# Patient Record
Sex: Female | Born: 2000 | Race: White | Hispanic: No | Marital: Single | Smoking: Never smoker
Health system: Southern US, Community
[De-identification: ages and names within clinical notes are randomized; demographics above are authoritative.]

## PROBLEM LIST (undated history)

## (undated) DIAGNOSIS — S0990XA Unspecified injury of head, initial encounter: Secondary | ICD-10-CM

## (undated) DIAGNOSIS — J302 Other seasonal allergic rhinitis: Secondary | ICD-10-CM

## (undated) DIAGNOSIS — J45909 Unspecified asthma, uncomplicated: Secondary | ICD-10-CM

---
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
## 2019-08-19 IMAGING — CT CT ABD-PELV W/ CM
2 of 4 series · 16 of 46 positions shown, 18 images · IV contrast (APPLIED)
Comparison: None.

CLINICAL DATA: Sudden onset sharp right-sided abdominal pain.

EXAM:
CT ABDOMEN AND PELVIS WITH CONTRAST
TECHNIQUE: Multidetector CT imaging of the abdomen and pelvis was performed
using the standard protocol following bolus administration of
intravenous contrast.
CONTRAST:  100mL JPT1DW-SVV IOPAMIDOL (JPT1DW-SVV) INJECTION 61%

[Series 2: axial st · axial · 0.87mm/px · z∈[+233,+703]mm · 13 of 102 slices shown, 15 images]
[im 4/102  soft-tissue]
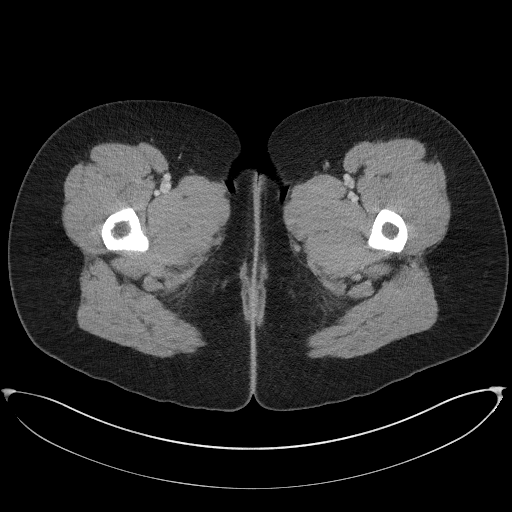
[im 4/102  bone]
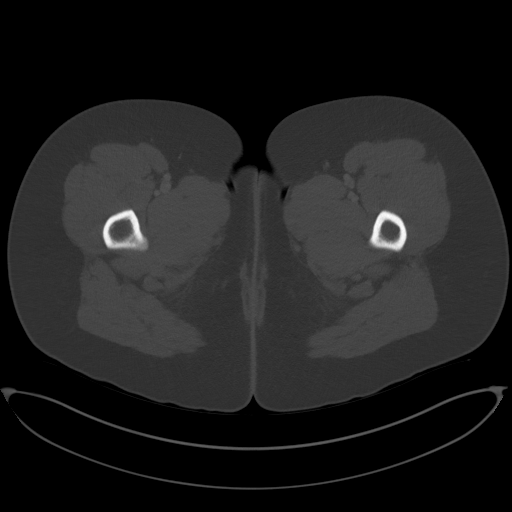
[im 12/102  soft-tissue]
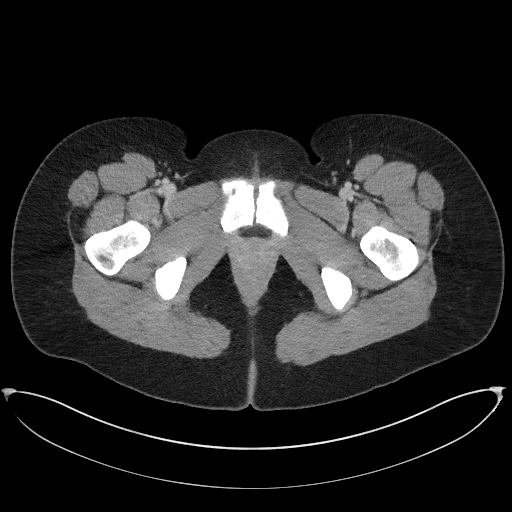
[im 20/102  soft-tissue]
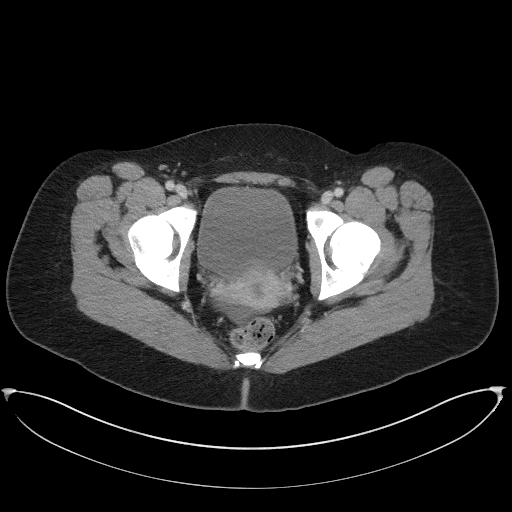
[im 28/102  soft-tissue]
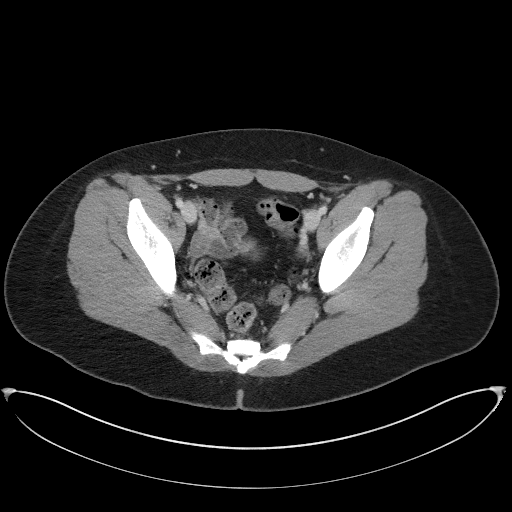
[im 35/102  soft-tissue]
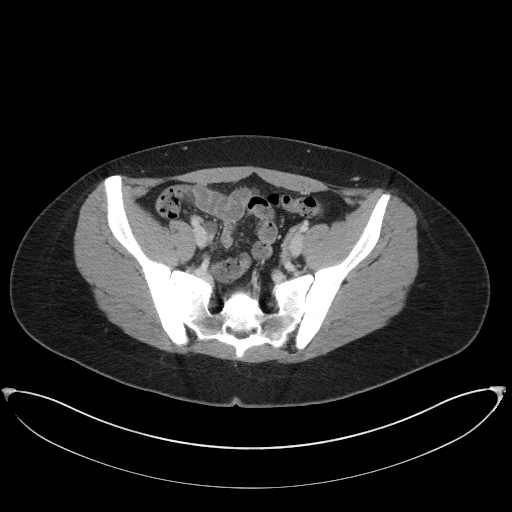
[im 43/102  soft-tissue]
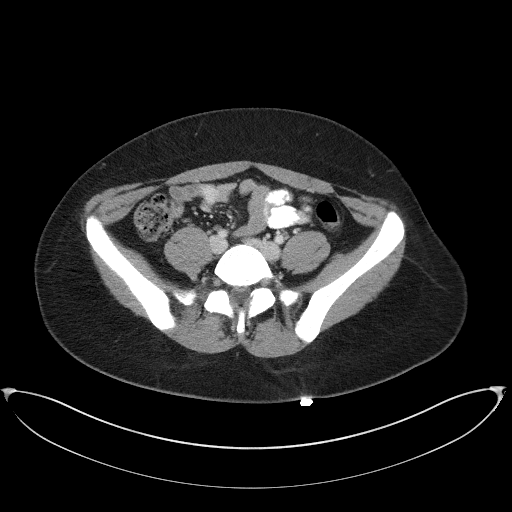
[im 51/102  soft-tissue]
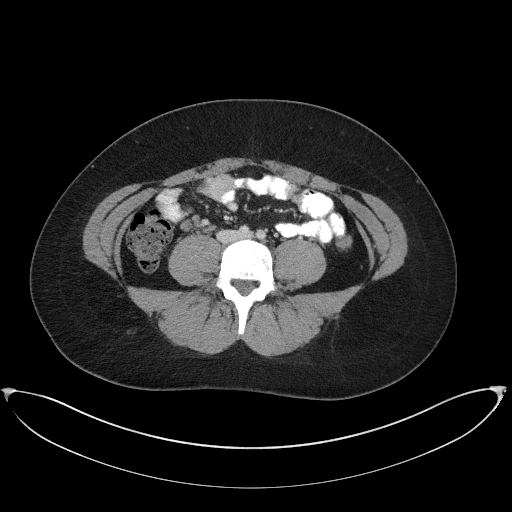
[im 59/102  soft-tissue]
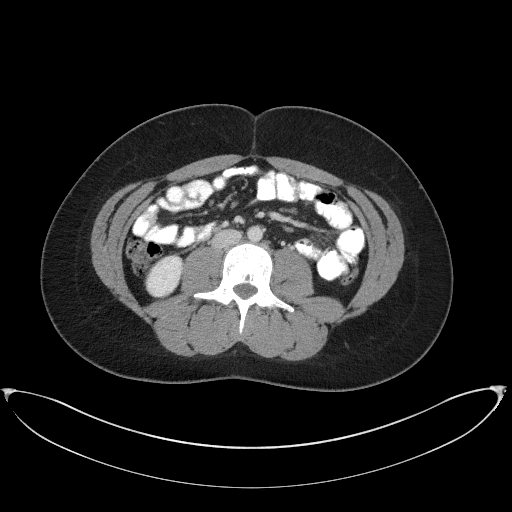
[im 67/102  soft-tissue]
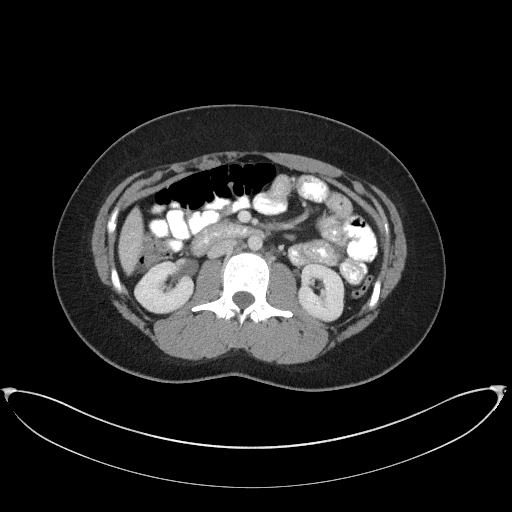
[im 67/102  bone]
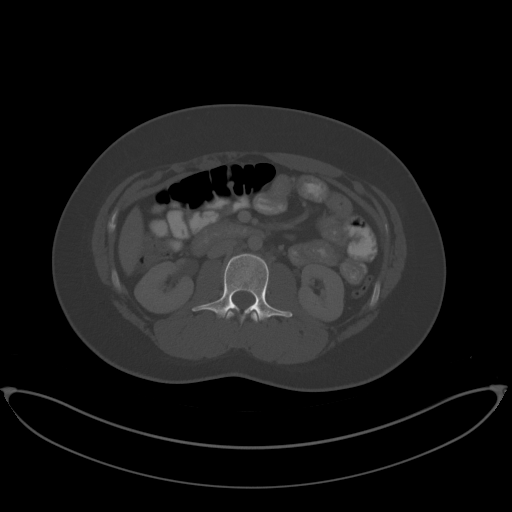
[im 74/102  soft-tissue]
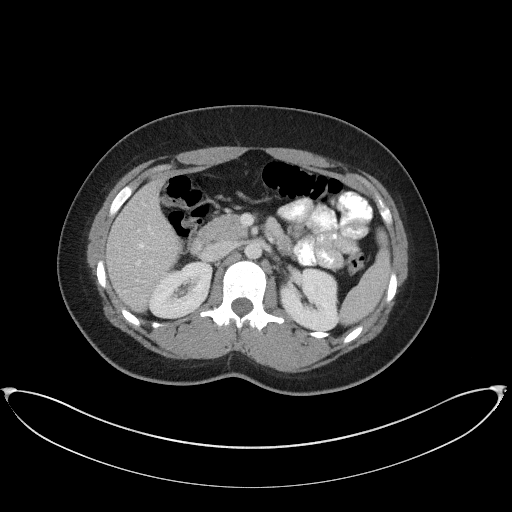
[im 82/102  soft-tissue]
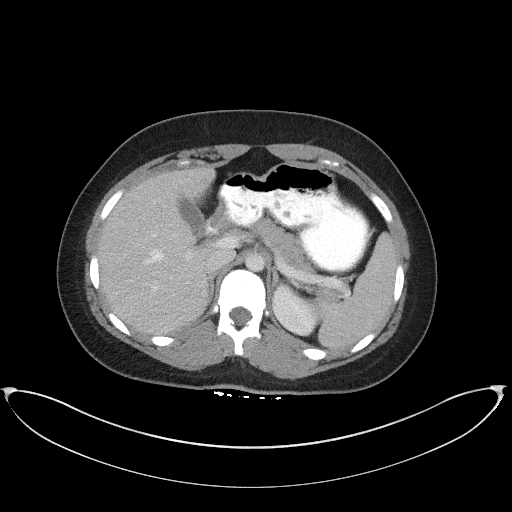
[im 90/102  soft-tissue]
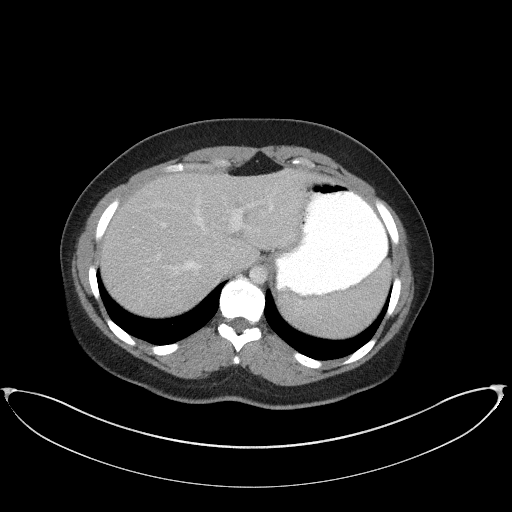
[im 98/102  soft-tissue]
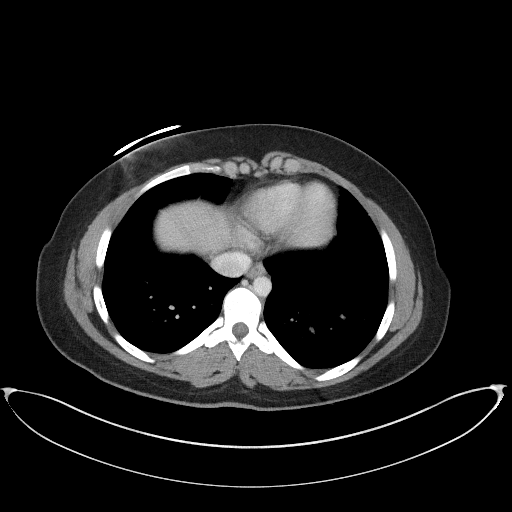

[Series 5: coronal st · coronal · 0.86mm/px · 3 of 85 slices shown]
[im 29/85  soft-tissue]
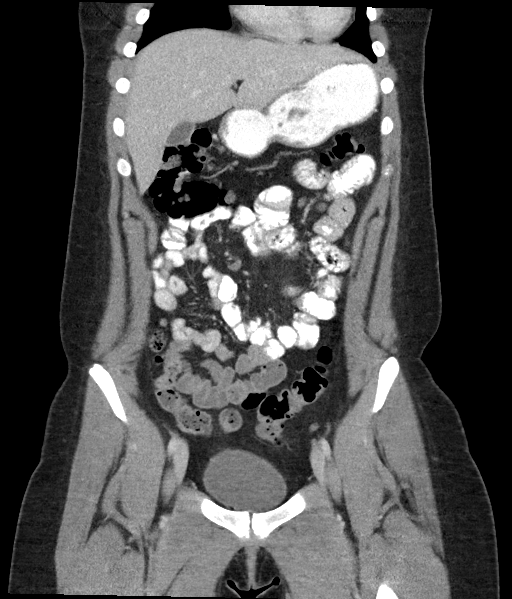
[im 38/85  soft-tissue]
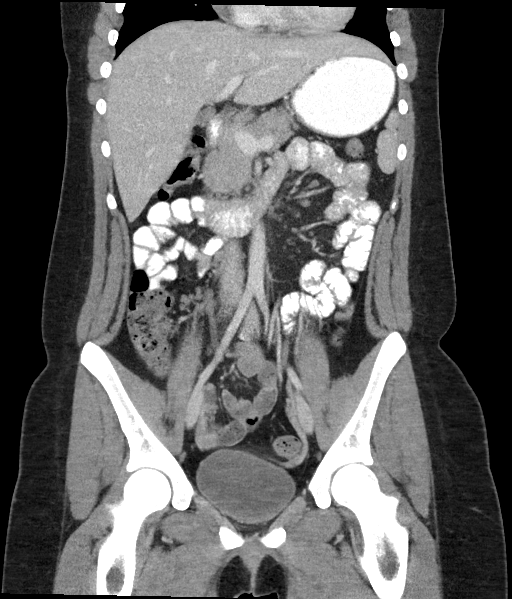
[im 47/85  soft-tissue]
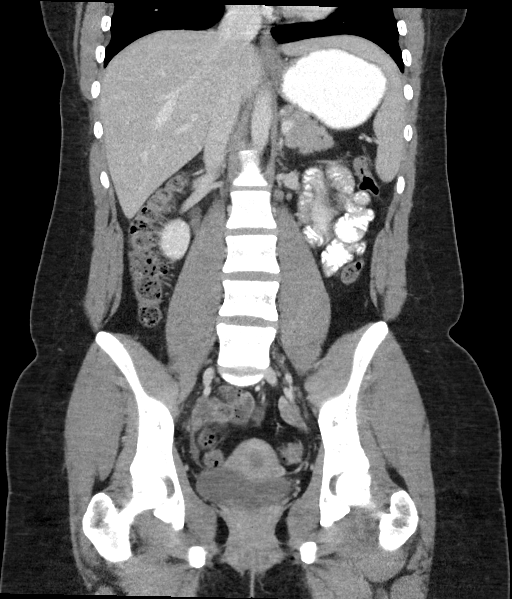

[16 of 46 positions shown; findings below may reference images not displayed]

FINDINGS: Lower chest: Unremarkable.

Hepatobiliary: No suspicious focal abnormality within the liver
parenchyma. There is no evidence for gallstones, gallbladder wall
thickening, or pericholecystic fluid. No intrahepatic or
extrahepatic biliary dilation.

Pancreas: No focal mass lesion. No dilatation of the main duct. No
intraparenchymal cyst. No peripancreatic edema.

Spleen: No splenomegaly. No focal mass lesion.

Adrenals/Urinary Tract: No adrenal nodule or mass. Kidneys
unremarkable No evidence for hydroureter. The urinary bladder
appears normal for the degree of distention.

Stomach/Bowel: Stomach is unremarkable. No gastric wall thickening.
No evidence of outlet obstruction. Duodenum is normally positioned
as is the ligament of Treitz. No small bowel wall thickening. No
small bowel dilatation. The terminal ileum is normal. The appendix
is normal. No gross colonic mass. No colonic wall thickening.

Vascular/Lymphatic: No abdominal aortic aneurysm. No abdominal
aortic atherosclerotic calcification. There is no gastrohepatic or
hepatoduodenal ligament lymphadenopathy. No intraperitoneal or
retroperitoneal lymphadenopathy. No pelvic sidewall lymphadenopathy.

Reproductive: The uterus is unremarkable.  There is no adnexal mass.

Other: Trace free fluid noted in the cul-de-sac, a finding that can
be physiologic in a premenopausal female..

Musculoskeletal: No worrisome lytic or sclerotic osseous
abnormality.
IMPRESSION: 1. No acute findings in the abdomen or pelvis. Specifically, no
findings to explain the patient's history of right abdominal pain.
Terminal ileum and appendix are normal. No adnexal mass.

## 2021-06-12 ENCOUNTER — Ambulatory Visit: Admit: 2021-06-12 | Discharge: 2021-06-12 | Payer: BLUE CROSS/BLUE SHIELD | Attending: Family Medicine

## 2021-06-12 DIAGNOSIS — J0100 Acute maxillary sinusitis, unspecified: Secondary | ICD-10-CM

## 2021-06-12 LAB — POC COVID-19 (LIAT IN HOUSE): SARS-CoV-2: NOT DETECTED

## 2021-06-12 MED ORDER — AMOXICILLIN 875 MG PO TABS
875 MG | ORAL_TABLET | Freq: Two times a day (BID) | ORAL | 0 refills | Status: AC
Start: 2021-06-12 — End: 2021-06-22

## 2021-06-12 MED ORDER — AMOXICILLIN 875 MG PO TABS
875 MG | ORAL_TABLET | Freq: Two times a day (BID) | ORAL | 0 refills | Status: DC
Start: 2021-06-12 — End: 2021-06-12

## 2021-06-12 NOTE — Addendum Note (Signed)
Addended by: Geraldo Pitter on: 06/12/2021 11:20 AM     Modules accepted: Orders

## 2021-06-12 NOTE — Progress Notes (Signed)
[employer]    Source: Patient appears reliable    Add'l history from: Epic records    Time Patient seen by Provider:11:17 AM     Chief complaint:    Chief Complaint   Patient presents with    Congestion    Cough    Headache    Fatigue     All symptoms x 3 days       HISTORY:    Lindsey Holland is a 20 y.o. female who presents for evaluation of   Chief Complaint   Patient presents with    Congestion    Cough    Headache    Fatigue     All symptoms x 3 days     20 year old female with past medical history as noted now presents with complaint of recurrent seven-day history of facial and pressure which is been worsening over the past 2 days. + Purulent discharge. + Dry cough, headache, feverish without measured fever, fatigue. No known exposure to specific illness. Patient states that she has been ill frequently over the past semester while at college at [employer]    PAST MEDICAL HISTORY:    Patient's Past Medical History Reviewed and confirmed in epic as below:    Past Medical History:   Diagnosis Date    Allergic rhinitis     Asthma        Patient's Past Medical History Reviewed and confirmed in epic as below:    No past surgical history on file.    Patient's Family History: Reviewed and confirmed in epic as below:    No family history on file.    Patient's Social History: Reviewed and confirmed in epic as below:    Social History     Socioeconomic History    Marital status: Single       Patient's Allergies Reviewed and confirmed as listed below    No Known Allergies    Current Medications reviewed and confirmed:    Outpatient Medications Marked as Taking for the 06/12/21 encounter (Office Visit) with Geraldo Pitter, MD   Medication Sig Dispense Refill    amoxicillin (AMOXIL) 875 MG tablet Take 1 tablet by mouth 2 times daily for 10 days 20 tablet 0       Nursing Notes and Vital Signs reviewed    EXAM:    VITALS: BP 136/80    Pulse 85    Temp 98.4 ??F (36.9 ??C)    Resp 14    SpO2 100%     GENERAL: alert, well appearing,  well nourished, no distress    HEAD: Normocephalic, no masses, lesions, or other abnormalities.    EYE EXAM: normal conjunctiva    EARS: External ears normal, hearing grossly intact.    NOSE: no deformity.copious purulent discharge and edematous mucus membranes    OROPHARYNX: lips and tongue normal. No erythema    HEART: regular rate and rhythm    LUNGS: Normal respiratory rate and normal respiratory effort. Good air movement. Bilaterally clear          DIAGNOSIS:    Diagnosis Orders   1. Acute non-recurrent maxillary sinusitis  amoxicillin (AMOXIL) 875 MG tablet      2. Nasal congestion  POC COVID-19 ([facility])          PLAN:  1. Acute non-recurrent maxillary sinusitis  -     amoxicillin (AMOXIL) 875 MG tablet; Take 1 tablet by mouth 2 times daily for 10 days, Disp-20 tablet, R-0Normal  2. Nasal congestion  -     POC COVID-19 ([facility]); Future      Results for orders placed or performed in visit on 06/12/21   POC COVID-19 ([facility])   Result Value Ref Range    SARS-CoV-2 Not Detected Not Detected       Current Outpatient Medications   Medication Sig Dispense Refill    amoxicillin (AMOXIL) 875 MG tablet Take 1 tablet by mouth 2 times daily for 10 days 20 tablet 0    albuterol sulfate HFA (PROVENTIL;VENTOLIN;PROAIR) 108 (90 Base) MCG/ACT inhaler  (Patient not taking: Reported on 06/12/2021)       No current facility-administered medications for this visit.        Orders Placed This Encounter   Medications    amoxicillin (AMOXIL) 875 MG tablet     Sig: Take 1 tablet by mouth 2 times daily for 10 days     Dispense:  20 tablet     Refill:  0     Recommend OTC medication as needed for symptom management  Increase by mouth fluids and rest    ADVISED PATIENT TO SEEK MEDICAL ATTENTION FOR SYMPTOMS PERSISTING MORE THAN ONE WEEK OR FOR WORSENING SYMPTOMS SUCH AS FEVER 100.4 OR GREATER FOR MORE THAN 2 DAYS, INCREASED WHEEZING, SHORTNESS OF BREATH, FATIGUE OR CHEST PAIN.    Geraldo Pitter, MD

## 2023-10-22 ENCOUNTER — Ambulatory Visit: Admit: 2023-10-22 | Discharge: 2023-10-22 | Payer: BLUE CROSS/BLUE SHIELD | Attending: Family

## 2023-10-22 VITALS — BP 122/72 | HR 88 | Temp 99.20000°F | Wt 199.0 lb

## 2023-10-22 DIAGNOSIS — R062 Wheezing: Secondary | ICD-10-CM

## 2023-10-22 MED ORDER — IPRATROPIUM BROMIDE 0.03 % NA SOLN
0.03 | Freq: Two times a day (BID) | NASAL | 0 refills | 5.00000 days | Status: AC
Start: 2023-10-22 — End: ?

## 2023-10-22 MED ORDER — PREDNISONE 20 MG PO TABS
20 | ORAL_TABLET | Freq: Every day | ORAL | 0 refills | 6.00000 days | Status: AC
Start: 2023-10-22 — End: 2023-10-27

## 2023-10-22 NOTE — Progress Notes (Signed)
 Lindsey Holland (DOB:  05/13/2001) is a 23 y.o. female, presents for evaluation of the following chief complaint(s):  Congestion (Sinus pressure, bilateral ear pressure. Started 5 days ago. Has been taking xyzal daily, and muicnex the past two days. Denies any fever/bodyaches. )        Subjective   SUBJECTIVE/OBJECTIVE:  Pleasant 23 yo female w/ pmh asthma presents to clinic w/ c/o sinus congestion, sinus pressure, bilat ear pressure, non prod cough w/ occ wheeze x 5 days. Has noticed a mild improvement of symptoms. Denies sob, cp, n/v/d, body aches, chills, rashes. Has been treating w/ xyzal for seasonal allergies, mucinex the past two days for URI symptoms, and albuterol inhaler for asthma exacerbation w/ some relief. Denies recent known sick contacts/travel.        BP 122/72   Pulse 88   Temp 99.2 F (37.3 C)   Wt 90.3 kg (199 lb)   SpO2 98%      No Known Allergies   Current Outpatient Medications   Medication Sig Dispense Refill    predniSONE  (DELTASONE ) 20 MG tablet Take 2 tablets by mouth daily for 5 days 10 tablet 0    ipratropium (ATROVENT ) 0.03 % nasal spray 2 sprays by Each Nostril route 2 times daily 1 each 0    albuterol sulfate HFA (PROVENTIL;VENTOLIN;PROAIR) 108 (90 Base) MCG/ACT inhaler  (Patient not taking: Reported on 10/22/2023)       No current facility-administered medications for this visit.      Past Medical History:   Diagnosis Date    Allergic rhinitis     Asthma       No past surgical history on file.   No family history on file.   Social History     Occupational History    Not on file   Tobacco Use    Smoking status: Not on file    Smokeless tobacco: Not on file   Substance and Sexual Activity    Alcohol use: Not on file    Drug use: Not on file    Sexual activity: Not on file          Review of Systems   Constitutional:  Negative for fever.   HENT:  Positive for congestion, ear pain and sinus pressure. Negative for ear discharge and hearing loss.    Respiratory:  Positive for cough  and wheezing. Negative for shortness of breath.    Cardiovascular:  Negative for chest pain.   All other systems reviewed and are negative.         Objective   Physical Exam  Vitals and nursing note reviewed.   Constitutional:       Appearance: Normal appearance.   HENT:      Head: Normocephalic and atraumatic.      Right Ear: Hearing, ear canal and external ear normal. A middle ear effusion is present.      Left Ear: Hearing, ear canal and external ear normal. A middle ear effusion is present.      Nose: Congestion and rhinorrhea present. Rhinorrhea is clear.      Right Turbinates: Not swollen.      Left Turbinates: Not swollen.      Right Sinus: No maxillary sinus tenderness or frontal sinus tenderness.      Left Sinus: No maxillary sinus tenderness or frontal sinus tenderness.      Mouth/Throat:      Lips: Pink.      Mouth: Mucous membranes are moist.  Pharynx: Oropharynx is clear. Uvula midline. Posterior oropharyngeal erythema and postnasal drip present. No pharyngeal swelling, oropharyngeal exudate or uvula swelling.      Comments: +cobblestoning  Eyes:      Extraocular Movements: Extraocular movements intact.      Conjunctiva/sclera: Conjunctivae normal.      Pupils: Pupils are equal, round, and reactive to light.   Cardiovascular:      Rate and Rhythm: Normal rate and regular rhythm.      Heart sounds: Normal heart sounds.   Pulmonary:      Effort: Pulmonary effort is normal.      Breath sounds: Normal breath sounds.   Skin:     General: Skin is warm and dry.   Neurological:      General: No focal deficit present.      Mental Status: She is alert and oriented to person, place, and time.   Psychiatric:         Mood and Affect: Mood normal.         Behavior: Behavior normal.         Thought Content: Thought content normal.         Judgment: Judgment normal.        Symptoms consistent w/ allergic rhinitis. Take prednisone w/ food to avoid gi upset. Recommend decongestants (sudafed, coricidin), nasal sprays  (azelastine, Flonase, afrin), antihistamines (zyrtec, allegra) as needed for comfort. Reviewed sinus spray administration technique "look at toes, spray up nose". Tylenol/ibuprofen for discomfort as needed. Steam, sinus rinses, humidification for symptom management. Increase oral fluid intake, rest, practice good hand hygiene. Follow up if symptoms persist or worsen. ER precautions for severely worsening symptoms.            ASSESSMENT/PLAN:  1. Wheeze  -     predniSONE (DELTASONE) 20 MG tablet; Take 2 tablets by mouth daily for 5 days, Disp-10 tablet, R-0Normal  2. Sinus pressure  -     ipratropium (ATROVENT) 0.03 % nasal spray; 2 sprays by Each Nostril route 2 times daily, Disp-1 each, R-0Normal      Return if symptoms worsen or fail to improve.           An electronic signature was used to authenticate this note.    --Ellan Gunner, APRN - NP
